# Patient Record
Sex: Male | Born: 1960 | Race: White | Hispanic: No | Marital: Married | Smoking: Former smoker
Health system: Southern US, Community
[De-identification: ages and names within clinical notes are randomized; demographics above are authoritative.]

---

## 2013-08-09 ENCOUNTER — Institutional Professional Consult (permissible substitution): Payer: Self-pay | Admitting: Critical Care Medicine

## 2013-09-06 ENCOUNTER — Institutional Professional Consult (permissible substitution): Payer: Self-pay | Admitting: Critical Care Medicine

## 2014-05-04 ENCOUNTER — Ambulatory Visit (INDEPENDENT_AMBULATORY_CARE_PROVIDER_SITE_OTHER)
Admission: RE | Admit: 2014-05-04 | Discharge: 2014-05-04 | Disposition: A | Discharge status: Home / Self Care | Payer: 59 | Source: Ambulatory Visit | Attending: Internal Medicine | Admitting: Internal Medicine

## 2014-05-04 ENCOUNTER — Ambulatory Visit (INDEPENDENT_AMBULATORY_CARE_PROVIDER_SITE_OTHER): Payer: 59 | Admitting: Internal Medicine

## 2014-05-04 ENCOUNTER — Encounter: Payer: Self-pay | Admitting: Internal Medicine

## 2014-05-04 ENCOUNTER — Other Ambulatory Visit (INDEPENDENT_AMBULATORY_CARE_PROVIDER_SITE_OTHER): Payer: 59

## 2014-05-04 VITALS — BP 126/84 | HR 60 | Ht 70.0 in | Wt 193.0 lb

## 2014-05-04 DIAGNOSIS — R05 Cough: Secondary | ICD-10-CM

## 2014-05-04 DIAGNOSIS — R058 Other specified cough: Secondary | ICD-10-CM

## 2014-05-04 LAB — CBC WITH DIFFERENTIAL/PLATELET
BASOS PCT: 0.7 % (ref 0.0–3.0)
Basophils Absolute: 0.1 10*3/uL (ref 0.0–0.1)
EOS ABS: 0.3 10*3/uL (ref 0.0–0.7)
EOS PCT: 3.9 % (ref 0.0–5.0)
HCT: 44.3 % (ref 39.0–52.0)
Hemoglobin: 14.9 g/dL (ref 13.0–17.0)
LYMPHS ABS: 2.1 10*3/uL (ref 0.7–4.0)
Lymphocytes Relative: 26.6 % (ref 12.0–46.0)
MCHC: 33.7 g/dL (ref 30.0–36.0)
MCV: 88.4 fl (ref 78.0–100.0)
MONO ABS: 0.5 10*3/uL (ref 0.1–1.0)
MONOS PCT: 6 % (ref 3.0–12.0)
NEUTROS ABS: 5 10*3/uL (ref 1.4–7.7)
Neutrophils Relative %: 62.8 % (ref 43.0–77.0)
Platelets: 450 10*3/uL — ABNORMAL HIGH (ref 150.0–400.0)
RBC: 5.01 Mil/uL (ref 4.22–5.81)
RDW: 13.8 % (ref 11.5–15.5)
WBC: 8 10*3/uL (ref 4.0–10.5)

## 2014-05-04 NOTE — Progress Notes (Signed)
Quick Note:  Spoke with pt and notified of results per Dr. Wert. Pt verbalized understanding and denied any questions.  ______ 

## 2014-05-04 NOTE — Patient Instructions (Signed)
prilosec 20 mg Take 30-60 min before first meal of the day and pepcid 20 mg at bedtime automatically   For drainage as needed take chlortrimeton (chlorpheniramine) 4 mg every 4 hours available over the counter (may cause drowsiness)   Stop allegra   GERD (REFLUX)  is an extremely common cause of respiratory symptoms just like yours , many times with no obvious heartburn at all.    It can be treated with medication, but also with lifestyle changes including avoidance of late meals, excessive alcohol, smoking cessation, and avoid fatty foods, chocolate, peppermint, colas, red wine, and acidic juices such as orange juice.  NO MINT OR MENTHOL PRODUCTS SO NO COUGH DROPS  USE SUGARLESS CANDY INSTEAD (Jolley ranchers or Stover's or Life Savers) or even ice chips will also do - the key is to swallow to prevent all throat clearing. NO OIL BASED VITAMINS - use powdered substitutes.  Please remember to go to the lab and x-ray department downstairs for your tests - we will call you with the results when they are available.  Please schedule a follow up office visit in 6 weeks, call sooner if needed with pfts

## 2014-05-04 NOTE — Progress Notes (Signed)
Subjective:    Patient ID: Justin Wright, male    DOB: 1960-03-15   MRN: [id number]  HPI   70 yowm quit smoking  2014 with cough dating back to around 2011 that worsened after quit and some better p started omeprazole /allegra but much worse with "colds"  3 x a year  eval by Dr Lysbeth Galas at the end of one of his " colds" and started on BREO and since has  developed a "different cough" so referred by Dr Lysbeth Galas to pulmonary clinic 05/04/2014    05/04/2014 1st [facility] office visit/ Justin Wright   Chief Complaint  Patient presents with  . Pulmonary Consult    Referred by Dr. Lysbeth Galas. Pt c/o cough on and off x 5 yrs "right now I am fine".  He states that when he has these "bouts of cough" he has alot of sputum productions and "feels like smothering at night".    typically no sob unless couging, bouts come on abruptly with lots of nasal and chest congestions but not really purulent sputum.  No obvious patterns in  day to day or daytime variabilty or assoc doe cp or chest tightness, subjective wheeze overt  hb symptoms. No unusual exp hx or h/o childhood pna/ asthma or knowledge of premature birth.  Sleeping ok without nocturnal  or early am exacerbation  of respiratory  c/o's or need for noct saba. Also denies any obvious fluctuation of symptoms with weather or environmental changes or other aggravating or alleviating factors except as outlined above   Current Medications, Allergies, Complete Past Medical History, Past Surgical History, Family History, and Social History were reviewed in [employer] record.      Review of Systems  Constitutional: Negative for fever, chills, activity change, appetite change and unexpected weight change.  HENT: Negative for congestion, dental problem, postnasal drip, rhinorrhea, sneezing, sore throat, trouble swallowing and voice change.   Eyes: Negative for visual disturbance.  Respiratory: Negative for cough, choking and shortness  of breath.   Cardiovascular: Negative for chest pain and leg swelling.  Gastrointestinal: Negative for nausea, vomiting and abdominal pain.  Genitourinary: Negative for difficulty urinating.  Musculoskeletal: Negative for arthralgias.  Skin: Negative for rash.  Psychiatric/Behavioral: Negative for behavioral problems and confusion.       Objective:   Physical Exam  amb wm nad   Wt Readings from Last 3 Encounters:  05/04/14 193 lb (87.544 kg)    Vital signs reviewed    HEENT: nl dentition, turbinates, and orophanx. Nl external ear canals without cough reflex   NECK :  without JVD/Nodes/TM/ nl carotid upstrokes bilaterally   LUNGS: no acc muscle use, clear to A and P bilaterally without cough on insp or exp maneuvers   CV:  RRR  no s3 or murmur or increase in P2, no edema   ABD:  soft and nontender with nl excursion in the supine position. No bruits or organomegaly, bowel sounds nl  MS:  warm without deformities, calf tenderness, cyanosis or clubbing  SKIN: warm and dry without lesions    NEURO:  alert, approp, no deficits     CXR PA and Lateral:   05/04/2014 :     I personally reviewed images and agree with radiology impression as follows:   The lungs are adequately inflated and clear. The heart and pulmonary vascularity are normal. The mediastinum is normal in width. There is no pleural effusion. The bony thorax is unremarkable.    Assessment &  Plan:

## 2014-05-04 NOTE — Assessment & Plan Note (Addendum)
The most common causes of chronic cough in immunocompetent adults include the following: upper airway cough syndrome (UACS), previously referred to as postnasal drip syndrome (PNDS), which is caused by variety of rhinosinus conditions; (2) asthma; (3) GERD; (4) chronic bronchitis from cigarette smoking or other inhaled environmental irritants; (5) nonasthmatic eosinophilic bronchitis; and (6) bronchiectasis.   These conditions, singly or in combination, have accounted for up to 94% of the causes of chronic cough in prospective studies.   Other conditions have constituted no >6% of the causes in prospective studies These have included bronchogenic carcinoma, chronic interstitial pneumonia, sarcoidosis, left ventricular failure, ACEI-induced cough, and aspiration from a condition associated with pharyngeal dysfunction.    Chronic cough is often simultaneously caused by more than one condition. A single cause has been found from 38 to 82% of the time, multiple causes from 18 to 62%. Multiply caused cough has been the result of three diseases up to 42% of the time.       Based on hx  (that his symptoms  worsened p he stopped smoking) and exam, this is most likely:  Classic Upper airway cough syndrome, so named because it's frequently impossible to sort out how much is  CR/sinusitis with freq throat clearing (which can be related to primary GERD)   vs  causing  secondary (" extra esophageal")  GERD from wide swings in gastric pressure that occur with throat clearing, often  promoting self use of mint and menthol lozenges that reduce the lower esophageal sphincter tone and exacerbate the problem further in a cyclical fashion.   These are the same pts (now being labeled as having "irritable larynx syndrome" by some cough centers) who not infrequently have a history of having failed to tolerate ace inhibitors,  dry powder inhalers(like Breo, which he thinks changed the cough but didn't eliminate it)  or  biphosphonates or report having atypical reflux symptoms that don't respond to standard doses of PPI , and are easily confused as having aecopd or asthma flares by even experienced allergists/ pulmonologists.   The first step is to maximize acid suppression chronically and see if cough flares as bad next time off all maint inhalers and consider rx for cough variant asthma if this pattern persists on max gerd rx  In addition,  Since he was a smoker, needs baseline pfts on return   See instructions for specific recommendations which were reviewed directly with the patient who was given a copy with highlighter outlining the key components.

## 2014-05-05 LAB — ALLERGY FULL PROFILE
Allergen, D pternoyssinus,d7: <0.1 kU/L
Alternaria Alternata: <0.1 kU/L
Aspergillus fumigatus, m3: <0.1 kU/L
Bahia Grass: <0.1 kU/L
Bermuda Grass: <0.1 kU/L
Cat Dander: <0.1 kU/L
Common Ragweed: 0.41 kU/L — ABNORMAL HIGH
D. farinae: <0.1 kU/L
Dog Dander: <0.1 kU/L
Fescue: 0.58 kU/L — ABNORMAL HIGH
G005 RYE, PERENNIAL: 0.61 kU/L — AB
G009 Red Top: 0.52 kU/L — ABNORMAL HIGH
Goldenrod: <0.1 kU/L
Helminthosporium halodes: <0.1 kU/L
House Dust Hollister: <0.1 kU/L
IgE (Immunoglobulin E), Serum: 38 kU/L (ref <115)
Lamb's Quarters: <0.1 kU/L
Plantain: <0.1 kU/L
Stemphylium Botryosum: <0.1 kU/L
Sycamore Tree: 0.11 kU/L — ABNORMAL HIGH
Timothy Grass: 0.61 kU/L — ABNORMAL HIGH

## 2014-06-15 ENCOUNTER — Ambulatory Visit (INDEPENDENT_AMBULATORY_CARE_PROVIDER_SITE_OTHER): Payer: 59 | Admitting: Internal Medicine

## 2014-06-15 ENCOUNTER — Encounter: Payer: Self-pay | Admitting: Internal Medicine

## 2014-06-15 VITALS — BP 148/88 | HR 50 | Ht 69.0 in | Wt 193.0 lb

## 2014-06-15 DIAGNOSIS — R05 Cough: Secondary | ICD-10-CM

## 2014-06-15 DIAGNOSIS — R058 Other specified cough: Secondary | ICD-10-CM

## 2014-06-15 LAB — PULMONARY FUNCTION TEST
DL/VA % PRED: 93 %
DL/VA: 4.25 ml/min/mmHg/L
DLCO unc % pred: 92 %
DLCO unc: 28.68 ml/min/mmHg
FEF 25-75 Post: 2.88 L/sec
FEF 25-75 Pre: 2.76 L/sec
FEF2575-%CHANGE-POST: 4 %
FEF2575-%PRED-PRE: 87 %
FEF2575-%Pred-Post: 91 %
FEV1-%Change-Post: 1 %
FEV1-%PRED-POST: 98 %
FEV1-%PRED-PRE: 97 %
FEV1-Post: 3.6 L
FEV1-Pre: 3.56 L
FEV1FVC-%Change-Post: 0 %
FEV1FVC-%PRED-PRE: 97 %
FEV6-%CHANGE-POST: 1 %
FEV6-%PRED-PRE: 102 %
FEV6-%Pred-Post: 104 %
FEV6-Post: 4.77 L
FEV6-Pre: 4.69 L
FEV6FVC-%CHANGE-POST: 0 %
FEV6FVC-%Pred-Post: 104 %
FEV6FVC-%Pred-Pre: 103 %
FVC-%Change-Post: 1 %
FVC-%Pred-Post: 100 %
FVC-%Pred-Pre: 99 %
FVC-POST: 4.78 L
FVC-Pre: 4.73 L
POST FEV1/FVC RATIO: 75 %
POST FEV6/FVC RATIO: 100 %
PRE FEV1/FVC RATIO: 75 %
PRE FEV6/FVC RATIO: 100 %
RV % pred: 98 %
RV: 2.05 L
TLC % pred: 96 %
TLC: 6.58 L

## 2014-06-15 NOTE — Assessment & Plan Note (Signed)
-   allergy profile 05/04/2014 >  IgE 38 Pos RAST for  grass/ trees  - PFT's wnl 06/15/2014   I had an extended final summary discussion with the patient reviewing all relevant studies completed to date and  lasting 15 to 20 minutes of a 25 minute visit on the following issues:    1) No evidence at all for copd/ can't exclude asthma but doubt it  2) mutiple possibilities for what "sparks" the fire (viruses in winter, allergies in spring for example) but what likely drives his symptoms is cyclical coughing and gerd.  Explained the natural history of uri and why it's necessary in patients at risk to treat GERD aggressively - at least  short term -   to reduce risk of evolving cyclical cough initially  triggered by epithelial injury and a heightened sensitivty to the effects of any upper airway irritants,  most importantly acid - related - then perpetuated by epithelial injury related to the cough itself as the upper airway collapses on itself.  That is, the more sensitive the epithelium becomes once it is damaged by the virus, the more the ensuing irritability> the more the cough, the more the secondary reflux (especially in those prone to reflux) the more the irritation of the sensitive mucosa and so on in a  Classic cyclical pattern.     3) He might need to be considered for NF at some point but for now just rec he continue gerd rx x 3 m then taper and gi f/u prn  4) pulmonary f/u also prn

## 2014-06-15 NOTE — Progress Notes (Signed)
PFT done today. 

## 2014-06-15 NOTE — Progress Notes (Signed)
   Subjective:    Patient ID: Justin Wright, male    DOB: Oct 05, 1960   MRN: [id number]     Brief patient profile:  53 yowm quit smoking  2014 with cough dating back to around 2011 that worsened after quit and some better p started omeprazole /allegra but much worse with "colds"  3 x a year  eval by Dr Lysbeth Galas at the end of one of his " colds" and started on BREO and since has  developed a "different cough" so referred by Dr Lysbeth Galas to pulmonary clinic 05/04/2014     History of Present Illness  05/04/2014 1st [facility] office visit/ Justin Wright   Chief Complaint  Patient presents with  . Pulmonary Consult    Referred by Dr. Lysbeth Galas. Pt c/o cough on and off x 5 yrs "right now I am fine".  He states that when he has these "bouts of cough" he has alot of sputum productions and "feels like smothering at night".    typically no sob unless couging, bouts come on abruptly with lots of nasal and chest congestions but not really purulent sputum. rec prilosec 20 mg Take 30-60 min before first meal of the day and pepcid 20 mg at bedtime automatically  For drainage as needed take chlortrimeton (chlorpheniramine) 4 mg every 4 hours available over the counter (may cause drowsiness)  Stop allegra  GERD diet    06/15/2014 f/u ov/Justin Wright re: chronic cough x 5 y better on gerd rx  Chief Complaint  Patient presents with  . Follow-up    Discuss PFT results. Pt states breathing has improved.   in retrospect rx gerd has improved coughing  But doesn't stick with it            Objective:   Physical Exam  amb wm nad  Wt Readings from Last 3 Encounters:  06/15/14 193 lb (87.544 kg)  05/04/14 193 lb (87.544 kg)    Vital signs reviewed   HEENT: nl dentition, turbinates, and orophanx. Nl external ear canals without cough reflex   NECK :  without JVD/Nodes/TM/ nl carotid upstrokes bilaterally   LUNGS: no acc muscle use, clear to A and P bilaterally without cough on insp or exp maneuvers   CV:   RRR  no s3 or murmur or increase in P2, no edema   ABD:  soft and nontender with nl excursion in the supine position. No bruits or organomegaly, bowel sounds nl  MS:  warm without deformities, calf tenderness, cyanosis or clubbing  SKIN: warm and dry without lesions    NEURO:  alert, approp, no deficits     CXR PA and Lateral:   05/04/2014 :     I personally reviewed images and agree with radiology impression as follows:   The lungs are adequately inflated and clear. The heart and pulmonary vascularity are normal. The mediastinum is normal in width. There is no pleural effusion. The bony thorax is unremarkable.    Assessment & Plan:   Outpatient Encounter Prescriptions as of 06/15/2014  Medication Sig  . chlorpheniramine (CHLOR-TRIMETON) 4 MG tablet Take 4 mg by mouth as needed for allergies.  . famotidine (PEPCID) 20 MG tablet Take 20 mg by mouth 2 (two) times daily.  Marland Kitchen omeprazole (PRILOSEC) 20 MG capsule Take 20 mg by mouth daily.  . [DISCONTINUED] fexofenadine (ALLEGRA) 180 MG tablet Take 180 mg by mouth daily.

## 2014-06-15 NOTE — Patient Instructions (Signed)
Don't change any thing for 3 months then ok to taper it off by stopping omeprazole first then pepcid and restart immediately for any resp symptoms   If cough flares > delsym and see me if lasts more than 2weeks  Nissan procedure may be an option in future should you find you can't stop your acid suppression    If you are satisfied with your treatment plan,  let your doctor know and he/she can either refill your medications or you can return here when your prescription runs out.     If in any way you are not 100% satisfied,  please tell us.  If 100% better, tell your friends!  Pulmonary follow up is as needed

## 2016-03-05 IMAGING — CR DG CHEST 2V
2 series · 2 of 2 positions shown · non-contrast
Comparison: None.

CLINICAL DATA: Five years of chronic cough with intermittent
shortness of breath; previous history of tobacco use

EXAM:
CHEST  2 VIEW

[view not recorded (1 of 2)]
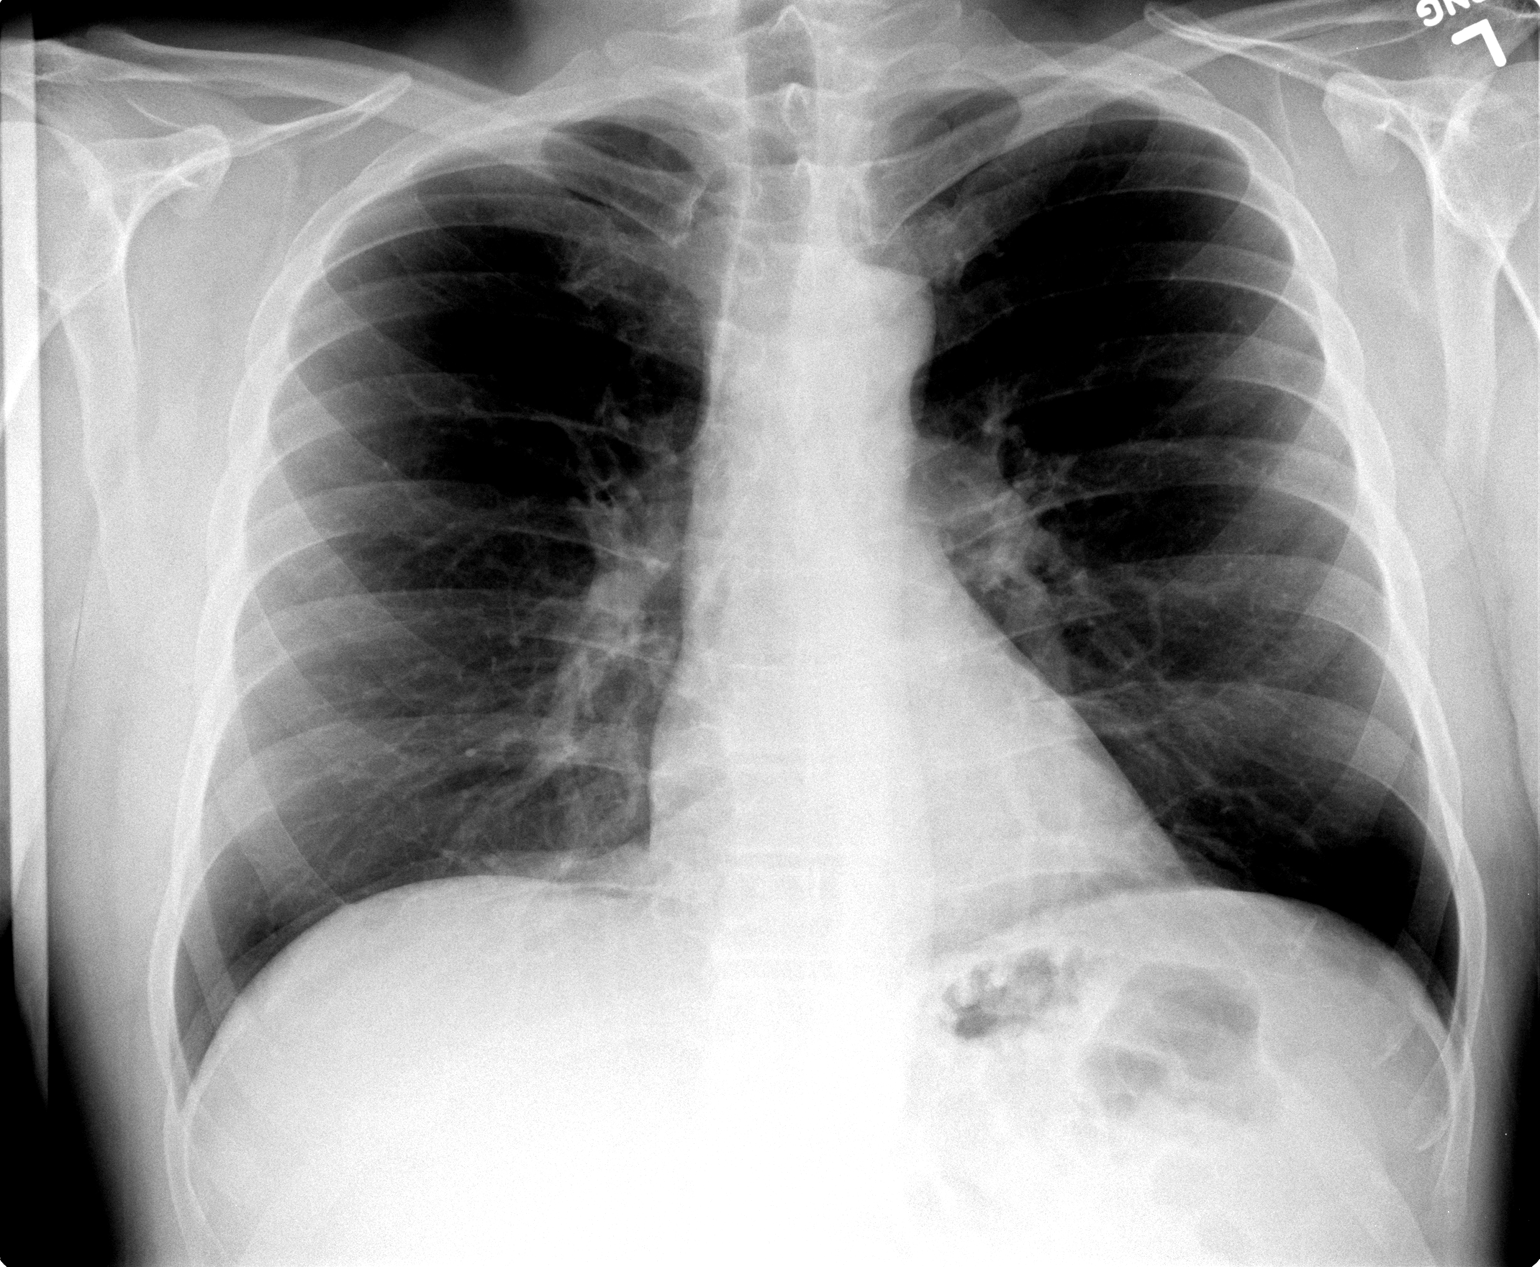

[view not recorded (2 of 2)]
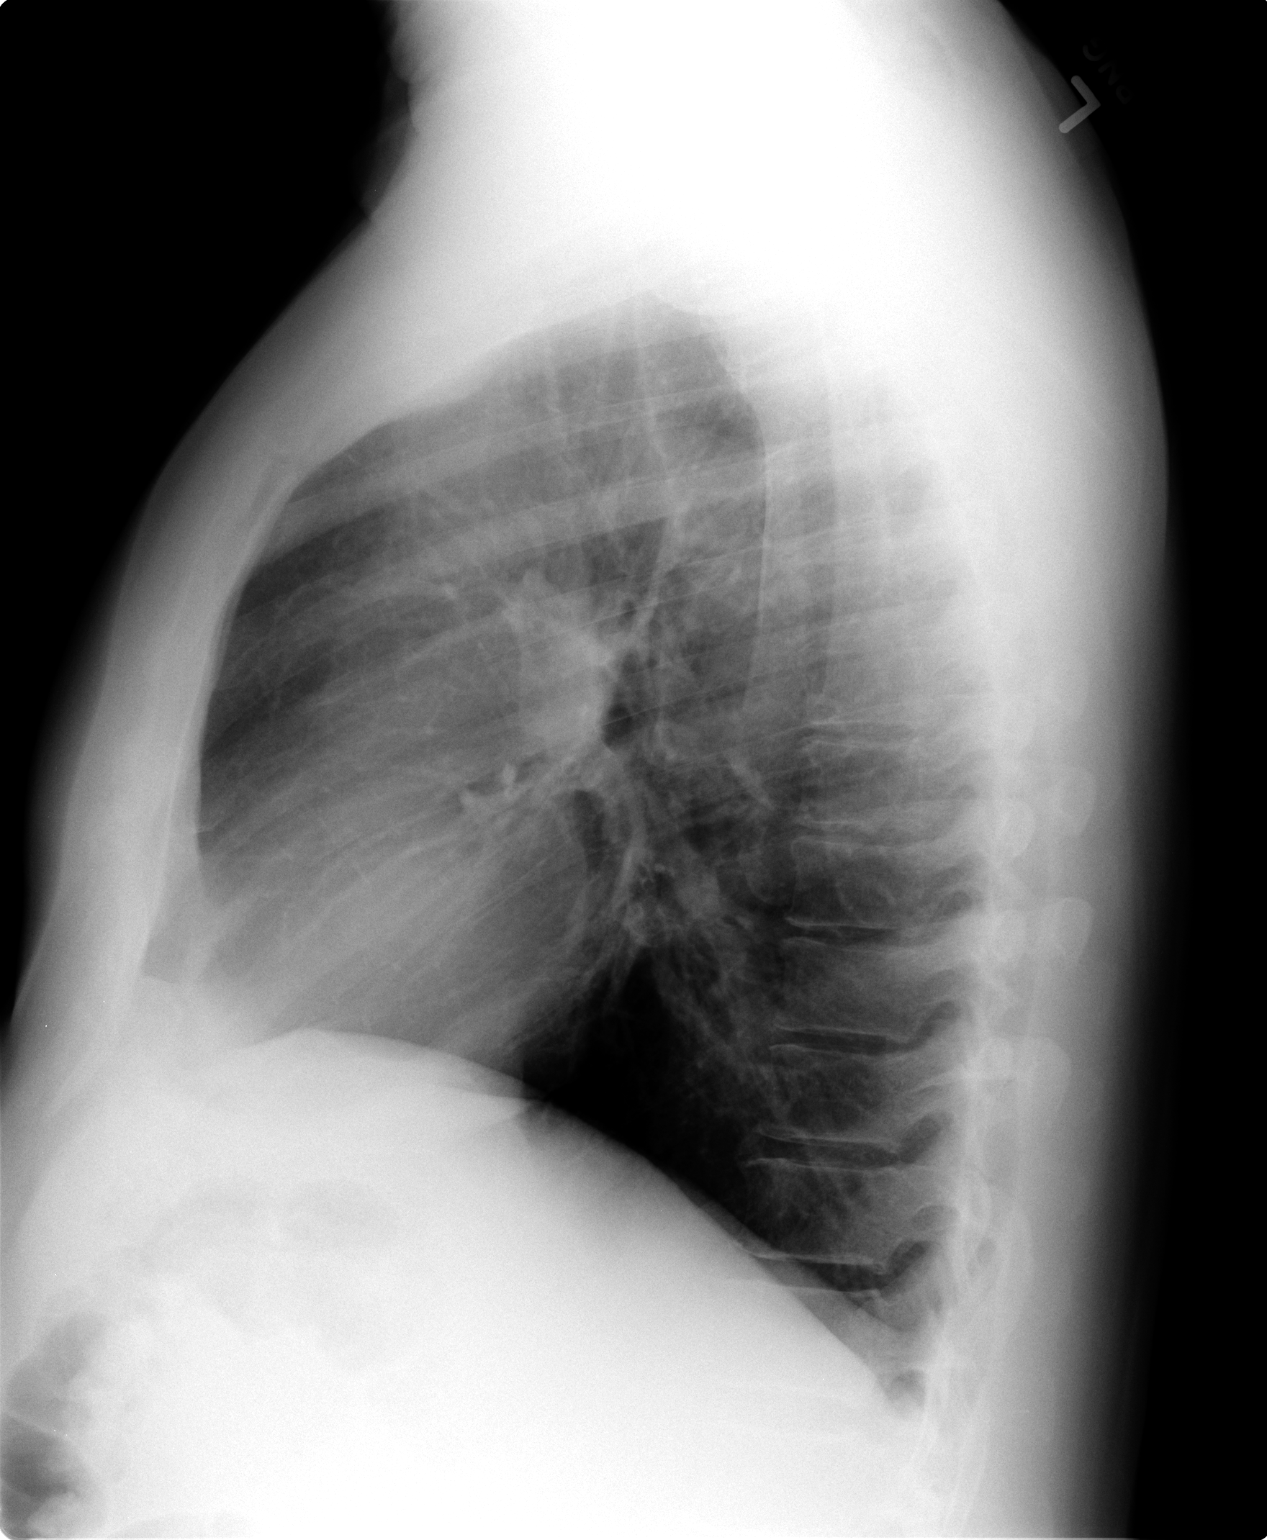

[2 of 2 positions shown; findings below may reference images not displayed]

FINDINGS: The lungs are adequately inflated and clear. The heart and pulmonary
vascularity are normal. The mediastinum is normal in width. There is
no pleural effusion. The bony thorax is unremarkable.
IMPRESSION: There is no active cardiopulmonary disease.

## 2016-11-07 DIAGNOSIS — Z125 Encounter for screening for malignant neoplasm of prostate: Secondary | ICD-10-CM | POA: Diagnosis not present

## 2016-11-07 DIAGNOSIS — Z0000 Encounter for general adult medical examination without abnormal findings: Secondary | ICD-10-CM | POA: Diagnosis not present

## 2017-09-14 DIAGNOSIS — D1801 Hemangioma of skin and subcutaneous tissue: Secondary | ICD-10-CM | POA: Diagnosis not present

## 2017-09-14 DIAGNOSIS — D485 Neoplasm of uncertain behavior of skin: Secondary | ICD-10-CM | POA: Diagnosis not present

## 2017-09-14 DIAGNOSIS — L578 Other skin changes due to chronic exposure to nonionizing radiation: Secondary | ICD-10-CM | POA: Diagnosis not present

## 2021-09-30 ENCOUNTER — Ambulatory Visit: Payer: 59 | Admitting: Allergy and Immunology

## 2023-10-16 ENCOUNTER — Emergency Department (HOSPITAL_COMMUNITY): Admitting: Certified Registered Nurse Anesthetist

## 2023-10-16 ENCOUNTER — Encounter (HOSPITAL_COMMUNITY): Payer: Self-pay | Admitting: Anesthesiology

## 2023-10-16 ENCOUNTER — Emergency Department (HOSPITAL_BASED_OUTPATIENT_CLINIC_OR_DEPARTMENT_OTHER): Admitting: Certified Registered Nurse Anesthetist

## 2023-10-16 ENCOUNTER — Ambulatory Visit (HOSPITAL_COMMUNITY)
Admission: EM | Admit: 2023-10-16 | Discharge: 2023-10-16 | Disposition: A | Discharge status: Home / Self Care | Attending: Emergency Medicine | Admitting: Emergency Medicine

## 2023-10-16 ENCOUNTER — Encounter (HOSPITAL_COMMUNITY)
Admission: EM | Disposition: A | Discharge status: Home / Self Care | Payer: Self-pay | Source: Home / Self Care | Attending: Emergency Medicine

## 2023-10-16 ENCOUNTER — Emergency Department (HOSPITAL_COMMUNITY)

## 2023-10-16 ENCOUNTER — Encounter (HOSPITAL_COMMUNITY): Payer: Self-pay

## 2023-10-16 ENCOUNTER — Other Ambulatory Visit: Payer: Self-pay

## 2023-10-16 DIAGNOSIS — Z87891 Personal history of nicotine dependence: Secondary | ICD-10-CM | POA: Insufficient documentation

## 2023-10-16 DIAGNOSIS — W458XXA Other foreign body or object entering through skin, initial encounter: Secondary | ICD-10-CM | POA: Diagnosis not present

## 2023-10-16 DIAGNOSIS — Z79899 Other long term (current) drug therapy: Secondary | ICD-10-CM | POA: Diagnosis not present

## 2023-10-16 DIAGNOSIS — K219 Gastro-esophageal reflux disease without esophagitis: Secondary | ICD-10-CM | POA: Diagnosis not present

## 2023-10-16 DIAGNOSIS — S68625A Partial traumatic transphalangeal amputation of left ring finger, initial encounter: Secondary | ICD-10-CM | POA: Insufficient documentation

## 2023-10-16 DIAGNOSIS — W3189XA Contact with other specified machinery, initial encounter: Secondary | ICD-10-CM | POA: Diagnosis not present

## 2023-10-16 DIAGNOSIS — S68115A Complete traumatic metacarpophalangeal amputation of left ring finger, initial encounter: Secondary | ICD-10-CM | POA: Diagnosis not present

## 2023-10-16 DIAGNOSIS — S68119A Complete traumatic metacarpophalangeal amputation of unspecified finger, initial encounter: Secondary | ICD-10-CM

## 2023-10-16 HISTORY — PX: AMPUTATION FINGER: SHX6594

## 2023-10-16 LAB — BASIC METABOLIC PANEL WITH GFR
Anion gap: 13 (ref 5–15)
BUN: 15 mg/dL (ref 8–23)
CO2: 24 mmol/L (ref 22–32)
Calcium: 9.7 mg/dL (ref 8.9–10.3)
Chloride: 106 mmol/L (ref 98–111)
Creatinine, Ser: 0.96 mg/dL (ref 0.61–1.24)
GFR, Estimated: >60 mL/min (ref >60)
Glucose, Bld: 84 mg/dL (ref 70–99)
Potassium: 4.1 mmol/L (ref 3.5–5.1)
Sodium: 144 mmol/L (ref 135–145)

## 2023-10-16 LAB — CBC WITH DIFFERENTIAL/PLATELET
Abs Immature Granulocytes: 0.02 K/uL (ref 0.00–0.07)
Basophils Absolute: 0.1 K/uL (ref 0.0–0.1)
Basophils Relative: 1 %
Eosinophils Absolute: 0.3 K/uL (ref 0.0–0.5)
Eosinophils Relative: 4 %
HCT: 42.7 % (ref 39.0–52.0)
Hemoglobin: 13.5 g/dL (ref 13.0–17.0)
Immature Granulocytes: 0 %
Lymphocytes Relative: 20 %
Lymphs Abs: 1.5 K/uL (ref 0.7–4.0)
MCH: 29.7 pg (ref 26.0–34.0)
MCHC: 31.6 g/dL (ref 30.0–36.0)
MCV: 94.1 fL (ref 80.0–100.0)
Monocytes Absolute: 0.5 K/uL (ref 0.1–1.0)
Monocytes Relative: 7 %
Neutro Abs: 5.1 K/uL (ref 1.7–7.7)
Neutrophils Relative %: 68 %
Platelets: 281 K/uL (ref 150–400)
RBC: 4.54 MIL/uL (ref 4.22–5.81)
RDW: 13.2 % (ref 11.5–15.5)
WBC: 7.5 K/uL (ref 4.0–10.5)
nRBC: 0 % (ref 0.0–0.2)

## 2023-10-16 SURGERY — AMPUTATION, FINGER
Anesthesia: Choice | Laterality: Left

## 2023-10-16 SURGERY — AMPUTATION, FINGER
Anesthesia: Monitor Anesthesia Care | Laterality: Left

## 2023-10-16 MED ORDER — OXYCODONE HCL 5 MG/5ML PO SOLN: 5.0000 mg | Freq: Once | ORAL | Status: DC | PRN

## 2023-10-16 MED ORDER — BACITRACIN ZINC 500 UNIT/GM EX OINT
TOPICAL_OINTMENT | CUTANEOUS | Status: AC
  Filled 2023-10-16: qty 28.35

## 2023-10-16 MED ORDER — ONDANSETRON HCL 4 MG/2ML IJ SOLN
INTRAMUSCULAR | Status: DC | PRN
  Administered 2023-10-16: 4 mg via INTRAVENOUS

## 2023-10-16 MED ORDER — FENTANYL CITRATE (PF) 100 MCG/2ML IJ SOLN
INTRAMUSCULAR | Status: AC
Start: 2023-10-16 — End: 2023-10-16
  Filled 2023-10-16: qty 2

## 2023-10-16 MED ORDER — FENTANYL CITRATE (PF) 250 MCG/5ML IJ SOLN
INTRAMUSCULAR | Status: DC | PRN
  Administered 2023-10-16: 50 ug via INTRAVENOUS

## 2023-10-16 MED ORDER — MIDAZOLAM HCL 2 MG/2ML IJ SOLN
INTRAMUSCULAR | Status: AC
  Filled 2023-10-16: qty 2

## 2023-10-16 MED ORDER — CEFAZOLIN SODIUM-DEXTROSE 2-4 GM/100ML-% IV SOLN
2.0000 g | Freq: Once | INTRAVENOUS | Status: AC
  Administered 2023-10-16: 2 g via INTRAVENOUS
  Filled 2023-10-16: qty 100

## 2023-10-16 MED ORDER — 0.9 % SODIUM CHLORIDE (POUR BTL) OPTIME
TOPICAL | Status: DC | PRN
  Administered 2023-10-16: 1000 mL

## 2023-10-16 MED ORDER — DROPERIDOL 2.5 MG/ML IJ SOLN: 0.6250 mg | Freq: Once | INTRAMUSCULAR | Status: DC | PRN

## 2023-10-16 MED ORDER — HYDROMORPHONE HCL 1 MG/ML IJ SOLN
1.0000 mg | Freq: Once | INTRAMUSCULAR | Status: AC
  Administered 2023-10-16: 1 mg via INTRAVENOUS
  Filled 2023-10-16: qty 1

## 2023-10-16 MED ORDER — PROPOFOL 10 MG/ML IV BOLUS
INTRAVENOUS | Status: DC | PRN
  Administered 2023-10-16: 20 mg via INTRAVENOUS

## 2023-10-16 MED ORDER — LACTATED RINGERS IV SOLN: INTRAVENOUS | Status: DC | PRN

## 2023-10-16 MED ORDER — FENTANYL CITRATE PF 50 MCG/ML IJ SOSY: 25.0000 ug | PREFILLED_SYRINGE | INTRAMUSCULAR | Status: DC | PRN

## 2023-10-16 MED ORDER — BUPIVACAINE HCL (PF) 0.25 % IJ SOLN
INTRAMUSCULAR | Status: AC
  Filled 2023-10-16: qty 30

## 2023-10-16 MED ORDER — BACITRACIN ZINC 500 UNIT/GM EX OINT
TOPICAL_OINTMENT | CUTANEOUS | Status: DC | PRN
  Administered 2023-10-16: 1 via TOPICAL

## 2023-10-16 MED ORDER — PROPOFOL 500 MG/50ML IV EMUL
INTRAVENOUS | Status: DC | PRN
  Administered 2023-10-16: 150 ug/kg/min via INTRAVENOUS

## 2023-10-16 MED ORDER — LIDOCAINE 2% (20 MG/ML) 5 ML SYRINGE
INTRAMUSCULAR | Status: DC | PRN
  Administered 2023-10-16: 40 mg via INTRAVENOUS

## 2023-10-16 MED ORDER — BUPIVACAINE HCL (PF) 0.25 % IJ SOLN
INTRAMUSCULAR | Status: DC | PRN
  Administered 2023-10-16: 10 mL

## 2023-10-16 MED ORDER — TETANUS-DIPHTH-ACELL PERTUSSIS 5-2.5-18.5 LF-MCG/0.5 IM SUSY
0.5000 mL | PREFILLED_SYRINGE | Freq: Once | INTRAMUSCULAR | Status: AC
  Administered 2023-10-16: 0.5 mL via INTRAMUSCULAR
  Filled 2023-10-16: qty 0.5

## 2023-10-16 MED ORDER — CEFAZOLIN SODIUM-DEXTROSE 2-4 GM/100ML-% IV SOLN: 2.0000 g | INTRAVENOUS | Status: DC

## 2023-10-16 MED ORDER — OXYCODONE HCL 5 MG PO TABS: 5.0000 mg | ORAL_TABLET | Freq: Four times a day (QID) | ORAL | 0 refills | Status: AC | PRN

## 2023-10-16 MED ORDER — MORPHINE SULFATE (PF) 4 MG/ML IV SOLN
4.0000 mg | Freq: Once | INTRAVENOUS | Status: AC
  Administered 2023-10-16: 4 mg via INTRAVENOUS
  Filled 2023-10-16: qty 1

## 2023-10-16 MED ORDER — OXYCODONE HCL 5 MG PO TABS: 5.0000 mg | ORAL_TABLET | Freq: Once | ORAL | Status: DC | PRN

## 2023-10-16 MED ORDER — CEFAZOLIN SODIUM-DEXTROSE 1-4 GM/50ML-% IV SOLN: 1.0000 g | Freq: Once | INTRAVENOUS | Status: DC

## 2023-10-16 MED ORDER — MIDAZOLAM HCL 2 MG/2ML IJ SOLN
INTRAMUSCULAR | Status: DC | PRN
  Administered 2023-10-16: 2 mg via INTRAVENOUS

## 2023-10-16 MED ORDER — CEPHALEXIN 500 MG PO CAPS: 500.0000 mg | ORAL_CAPSULE | Freq: Four times a day (QID) | ORAL | 0 refills | Status: AC

## 2023-10-16 SURGICAL SUPPLY — 60 items
BAG COUNTER SPONGE SURGICOUNT (BAG) ×2 IMPLANT
BLADE SURG 15 STRL LF DISP TIS (BLADE) ×4 IMPLANT
BNDG COHESIVE 1X5 TAN STRL LF (GAUZE/BANDAGES/DRESSINGS) IMPLANT
BNDG ELASTIC 2INX 5YD STR LF (GAUZE/BANDAGES/DRESSINGS) ×2 IMPLANT
BNDG ELASTIC 3INX 5YD STR LF (GAUZE/BANDAGES/DRESSINGS) ×2 IMPLANT
BNDG ELASTIC 4INX 5YD STR LF (GAUZE/BANDAGES/DRESSINGS) ×2 IMPLANT
BNDG ESMARK 4X9 LF (GAUZE/BANDAGES/DRESSINGS) ×2 IMPLANT
BNDG GAUZE DERMACEA FLUFF 4 (GAUZE/BANDAGES/DRESSINGS) ×6 IMPLANT
BNDG PLASTER X FAST 3X3 WHT LF (CAST SUPPLIES) ×2 IMPLANT
CATH ROBINSON RED A/P 10FR (CATHETERS) IMPLANT
CHLORAPREP W/TINT 26 (MISCELLANEOUS) ×2 IMPLANT
CORD BIPOLAR FORCEPS 12FT (ELECTRODE) ×2 IMPLANT
COVER BACK TABLE 60X90IN (DRAPES) ×2 IMPLANT
COVER MAYO STAND STRL (DRAPES) ×2 IMPLANT
COVER SURGICAL LIGHT HANDLE (MISCELLANEOUS) ×2 IMPLANT
CUFF TOURN SGL QUICK 18X4 (TOURNIQUET CUFF) ×2 IMPLANT
CUFF TRNQT CYL 24X4X16.5-23 (TOURNIQUET CUFF) IMPLANT
DRAIN PENROSE 0.25X18 (DRAIN) ×2 IMPLANT
DRAPE EXTREMITY T 121X128X90 (DISPOSABLE) ×2 IMPLANT
DRAPE OEC MINIVIEW 54X84 (DRAPES) IMPLANT
DRAPE SHEET LG 3/4 BI-LAMINATE (DRAPES) ×2 IMPLANT
DRAPE SURG 17X23 STRL (DRAPES) ×2 IMPLANT
DRSG ADAPTIC 3X8 NADH LF (GAUZE/BANDAGES/DRESSINGS) ×2 IMPLANT
DRSG XEROFORM 1X8 (GAUZE/BANDAGES/DRESSINGS) IMPLANT
GAUZE PAD ABD 8X10 STRL (GAUZE/BANDAGES/DRESSINGS) ×2 IMPLANT
GAUZE SPONGE 4X4 12PLY STRL (GAUZE/BANDAGES/DRESSINGS) ×2 IMPLANT
GAUZE STRETCH 2X75IN STRL (MISCELLANEOUS) IMPLANT
GAUZE XEROFORM 1X8 LF (GAUZE/BANDAGES/DRESSINGS) ×2 IMPLANT
GLOVE BIOGEL M 7.0 STRL (GLOVE) ×2 IMPLANT
GLOVE BIOGEL PI IND STRL 7.0 (GLOVE) ×2 IMPLANT
GLOVE SS BIOGEL STRL SZ 8 (GLOVE) ×2 IMPLANT
GLOVE SURG LX STRL 8.0 MICRO (GLOVE) ×2 IMPLANT
GOWN STRL REUS W/ TWL LRG LVL3 (GOWN DISPOSABLE) ×4 IMPLANT
GOWN STRL REUS W/ TWL XL LVL3 (GOWN DISPOSABLE) ×4 IMPLANT
KIT BASIN OR (CUSTOM PROCEDURE TRAY) ×2 IMPLANT
KIT TURNOVER KIT A (KITS) ×2 IMPLANT
LOOP VESSEL MAXI BLUE (MISCELLANEOUS) ×2 IMPLANT
MANIFOLD NEPTUNE II (INSTRUMENTS) ×2 IMPLANT
NDL HYPO 25X1 1.5 SAFETY (NEEDLE) ×2 IMPLANT
NEEDLE ANCHOR KEITH 2 7/8 STR (NEEDLE) IMPLANT
NEEDLE HYPO 25X1 1.5 SAFETY (NEEDLE) ×1 IMPLANT
NS IRRIG 1000ML POUR BTL (IV SOLUTION) ×2 IMPLANT
PACK ORTHO EXTREMITY (CUSTOM PROCEDURE TRAY) ×2 IMPLANT
PAD ARMBOARD POSITIONER FOAM (MISCELLANEOUS) ×2 IMPLANT
PAD CAST 4YDX4 CTTN HI CHSV (CAST SUPPLIES) ×4 IMPLANT
PADDING CAST ABS COTTON 3X4 (CAST SUPPLIES) ×2 IMPLANT
SET IRRIG Y TYPE TUR BLADDER L (SET/KITS/TRAYS/PACK) ×2 IMPLANT
SOL PREP POV-IOD 4OZ 10% (MISCELLANEOUS) ×4 IMPLANT
SPIKE FLUID TRANSFER (MISCELLANEOUS) ×2 IMPLANT
SPONGE T-LAP 4X18 ~~LOC~~+RFID (SPONGE) ×2 IMPLANT
SUT ETHILON 4 0 PS 2 18 (SUTURE) ×6 IMPLANT
SUT VICRYL RAPIDE 4/0 PS 2 (SUTURE) IMPLANT
SUTURE FIBERWR 3-0 18 TAPR NDL (SUTURE) ×2 IMPLANT
SWAB CULTURE ESWAB REG 1ML (MISCELLANEOUS) IMPLANT
SYR BULB EAR ULCER 3OZ GRN STR (SYRINGE) ×2 IMPLANT
SYR CONTROL 10ML LL (SYRINGE) IMPLANT
TUBING CONNECTING 10 (TUBING) ×2 IMPLANT
UNDERPAD 30X36 HEAVY ABSORB (UNDERPADS AND DIAPERS) ×2 IMPLANT
WATER STERILE IRR 1000ML POUR (IV SOLUTION) ×2 IMPLANT
YANKAUER SUCT BULB TIP NO VENT (SUCTIONS) ×2 IMPLANT

## 2023-10-16 NOTE — ED Provider Notes (Signed)
 [place] EMERGENCY DEPARTMENT AT [facility] Provider Note   CSN: [id number] Arrival date & time: 10/16/23  1335     Patient presents with: Hand Injury   KARAM DUNSON is a 63 y.o. male.   HPI   63 year old male presents emergency department with left ring finger injury. Sustained a distal amputation after using a log splitter. Does not have the finger tip with him. Is not on [facility], last Tdap around 10 years ago. No other injury sustained.  Prior to Admission medications   Medication Sig Start Date End Date Taking? Authorizing Provider  chlorpheniramine (CHLOR-TRIMETON) 4 MG tablet Take 4 mg by mouth as needed for allergies.    [provider]  famotidine (PEPCID) 20 MG tablet Take 20 mg by mouth 2 (two) times daily.    [provider]  omeprazole (PRILOSEC) 20 MG capsule Take 20 mg by mouth daily.    [provider]    Allergies: Patient has no known allergies.    Review of Systems  Constitutional:  Negative for fever.  Respiratory:  Negative for shortness of breath.   Cardiovascular:  Negative for chest pain.  Musculoskeletal:        Left ring finger injury  Neurological:  Negative for numbness.    Updated Vital Signs BP (!) 165/98 (BP Location: Right Arm)   Pulse 71   Temp 98 F (36.7 C) (Oral)   Resp 20   SpO2 97%   Physical Exam Vitals and nursing note reviewed.  Constitutional:      General: He is not in acute distress.    Appearance: Normal appearance.  HENT:     Head: Normocephalic.     Mouth/Throat:     Mouth: Mucous membranes are moist.  Cardiovascular:     Rate and Rhythm: Normal rate.  Pulmonary:     Effort: Pulmonary effort is normal. No respiratory distress.  Musculoskeletal:     Comments: Traumatic amputation of the left ring finger with exposed bone and tendon  Skin:    General: Skin is warm.  Neurological:     Mental Status: He is alert. Mental status is at baseline.  Psychiatric:        Mood and  Affect: Mood normal.          (all labs ordered are listed, but only abnormal results are displayed) Labs Reviewed  CBC WITH DIFFERENTIAL/PLATELET  BASIC METABOLIC PANEL WITH GFR    EKG: None  Radiology: DG Finger Ring Left Result Date: 10/16/2023 CLINICAL DATA:  Traumatic amputation. EXAM: LEFT RING FINGER 2+V COMPARISON:  None Available. FINDINGS: Status post traumatic amputation of the distal portion of the fourth distal phalanx with surrounding soft tissues. Moderately displaced fracture of proximal portion of fourth distal phalanx is noted. IMPRESSION: Traumatic amputation of distal portion of fourth distal phalanx with surrounding soft tissues. Moderately displaced fracture of proximal portion of fourth distal phalanx is noted. Electronically Signed   By: Lynwood Landy Raddle M.D.   On: 10/16/2023 16:41     Procedures   Medications Ordered in the ED  Tdap (BOOSTRIX) injection 0.5 mL (0.5 mLs Intramuscular Given 10/16/23 1607)  morphine  (PF) 4 MG/ML injection 4 mg (4 mg Intravenous Given 10/16/23 1605)  ceFAZolin  (ANCEF ) IVPB 2g/100 mL premix (2 g Intravenous New Bag/Given 10/16/23 1626)  Medical Decision Making Amount and/or Complexity of Data Reviewed Labs: ordered. Radiology: ordered.  Risk Prescription drug management.   63 year old male presents emergency department with left ring finger amputation using a wood splitting machine.  Currently does not have the tip of the finger with him.  This happened about 3 hours prior to arrival.  Not on anticoagulation, bleeding controlled, no other injury noted.  Vitals normal and stable.  He has an amputation of the fourth distal phalanx of the left hand.  Bleeding is controlled, there is exposed bone.  Blood work is normal.  X-ray confirms fourth distal phalanx amputation with displaced fracture.  Tetanus updated, Ancef  given.  Contacted hand surgeon Dr. Romona.  He has reviewed the chart and  recommends surgical repair tonight.  Patient will be taken to the OR with Dr. Romona.  On reevaluation patient's pain is currently controlled, vitals are normal.  Patient updated about surgical plan.  Patient taken to the OR with surgical staff.  Stable at time of transfer.     Final diagnoses:  None    ED Discharge Orders     None          Bari Roxie HERO, DO 10/16/23 1859

## 2023-10-16 NOTE — Interval H&P Note (Signed)
 History and Physical Interval Note:  10/16/2023 7:18 PM  Justin Wright  has presented today for surgery, with the diagnosis of revision of amputated finger.  The various methods of treatment have been discussed with the patient and family. After consideration of risks, benefits and other options for treatment, the patient has consented to  Procedure(s) with comments: REVISION OF AMPUTATION, LEFT RING FINGER (Left) - LEFT RING FINGER as a surgical intervention.  The patient's history has been reviewed, patient examined, no change in status, stable for surgery.  I have reviewed the patient's chart and labs.  Questions were answered to the patient's satisfaction.     Seher Schlagel

## 2023-10-16 NOTE — ED Triage Notes (Signed)
 Pt arrived via POV, c/o partial amputation to left hand, index finger from log splitter. Bone exposed.

## 2023-10-16 NOTE — H&P (View-Only) (Signed)
 HAND SURGERY CONSULTATION  REQUESTING PHYSICIAN: No att. providers found   Chief Complaint: Left ring finger amputation  HPI: Justin Wright is a 63 y.o. male who presents with an amputation of the left ring finger through the distal phalanx that occurred earlier today while using a log splitter.  He presented to the ER where the wound was evaluated and x-rays were obtained.  Patient describes pain at the tip of his ring finger.  He denies pain elsewhere in the hand.  Hand dominance: Right   History reviewed. No pertinent past medical history. History reviewed. No pertinent surgical history. Social History   Socioeconomic History   Marital status: Married    Spouse name: Not on file   Number of children: Not on file   Years of education: Not on file   Highest education level: Not on file  Occupational History   Occupation: Market researcher  Tobacco Use   Smoking status: Former    Current packs/day: 0.00    Average packs/day: 1 pack/day for 38.0 years (38.0 ttl pk-yrs)    Types: Cigarettes    Start date: 09/18/1974    Quit date: 09/17/2012    Years since quitting: 11.0   Smokeless tobacco: Never  Substance and Sexual Activity   Alcohol use: Yes    Alcohol/week: 4.0 - 6.0 standard drinks of alcohol    Types: 4 - 6 Cans of beer per week   Drug use: Not on file   Sexual activity: Not on file  Other Topics Concern   Not on file  Social History Narrative   Not on file   Social Drivers of Health   Financial Resource Strain: Not on file  Food Insecurity: Not on file  Transportation Needs: Not on file  Physical Activity: Not on file  Stress: Not on file  Social Connections: Not on file   Family History  Problem Relation Age of Onset   Pancreatic cancer Mother 54   - negative except otherwise stated in the family history section No Known Allergies Prior to Admission medications   Medication Sig Start Date End Date Taking? Authorizing Provider   famotidine (PEPCID) 40 MG tablet Take 40 mg by mouth at bedtime.   Yes [provider]  gabapentin (NEURONTIN) 100 MG capsule Take 100-200 mg by mouth See admin instructions. Take 200 mg by mouth at bedtime and an additional 100 mg up to two times a day as needed for pain   Yes [provider]  Ibuprofen 200 MG CAPS Take 400 mg by mouth every 6 (six) hours as needed (for pain).   Yes [provider]  pantoprazole (PROTONIX) 40 MG tablet Take 40 mg by mouth daily before breakfast.   Yes [provider]   DG Finger Ring Left Result Date: 10/16/2023 CLINICAL DATA:  Traumatic amputation. EXAM: LEFT RING FINGER 2+V COMPARISON:  None Available. FINDINGS: Status post traumatic amputation of the distal portion of the fourth distal phalanx with surrounding soft tissues. Moderately displaced fracture of proximal portion of fourth distal phalanx is noted. IMPRESSION: Traumatic amputation of distal portion of fourth distal phalanx with surrounding soft tissues. Moderately displaced fracture of proximal portion of fourth distal phalanx is noted. Electronically Signed   By: Lynwood Landy Raddle M.D.   On: 10/16/2023 16:41   - Positive ROS: All other systems have been reviewed and were otherwise negative with the exception of those mentioned in the HPI and as above.  Physical Exam: General: No acute  distress, resting comfortably Cardiovascular: BUE warm and well perfused, normal rate Respiratory: Normal WOB on RA Skin: Warm and dry Neurologic: Sensation intact distally Psychiatric: Patient is at baseline mood and affect  Left upper Extremity  Amputation of the left ring finger through the distal phalanx at the level of the proximal nail fold.  There is a large amount of exposed distal phalanx present within the wound bed.  The wound margins appear clean.  There does not appear to be any foreign contamination.  Patient has intact flexion at the ring finger PIP joint as well as  full range of motion throughout the remainder of his hand.  All the remaining fingers are pink and well-perfused.   Assessment: 63 year old male with amputation of the left ring finger through the distal phalanx.  Plan: I reviewed the nature of this injury with the patient at length.  After our discussion, we have elected to proceed with revision amputation of the left ring finger.  We reviewed the risks of the surgery at length as well as the expected postoperative course and recovery process.  Questions were encouraged and answered.  Informed consent was signed.  Thank you for the consult and the opportunity to see [name], M.D. [facility] 7:15 PM

## 2023-10-16 NOTE — Progress Notes (Addendum)
 Short Stay Nursing Note: prepared for emergent surgery per MD orders, client then transferred to PACU area to await for Surgeon arrival, verbal report provided to MDA, CRNA and PACU RN. Handoff Report provided at bedside.

## 2023-10-16 NOTE — Op Note (Signed)
   Date of Surgery: 10/16/2023  INDICATIONS: Patient is a 63 y.o.-year-old male with an amputation of the left ring finger through the proximal phalangeal base.  Risks, benefits, and alternatives to surgery were again discussed with the patient in the preoperative area. The patient wishes to proceed with surgery.  Informed consent was signed after our discussion.   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  PROCEDURE:  Revision amputation of left ring finger with closure using local advancement flap   SURGEON: Carlin Galla, M.D.  ASSIST:   ANESTHESIA:  Local, MAC  IV FLUIDS AND URINE: See anesthesia.  ESTIMATED BLOOD LOSS: <5 mL.  IMPLANTS: * No implants in log *   DRAINS: None  COMPLICATIONS: None  DESCRIPTION OF PROCEDURE: The patient was met in the preoperative holding area where the surgical site was marked and the informed consent form was signed.  The patient was then brought back to the operating room and remained on the stretcher.  A hand table was placed adjacent to the operative extremity and locked into place.  A formal timeout was performed to confirm that this was the correct patient, surgical side, surgical site, and surgical procedure.  All were present and in agreement. Following formal timeout, a local block was performed using 10 mL of 0.25% plain marcaine .  The left upper extremity was then prepped and draped in the usual and sterile fashion.   Following formal timeout, cordage Penrose drain was wrapped around the base of the finger, pulled taut, and served as a tourniquet.  I began by examining the wound.  There was an amputation through the distal phalangeal base such that there was very little distal phalanx left.  In fact, the FDP insertion site had avulsed and was fractured off.  I therefore elected to shorten the finger to the level of the middle phalanx.  This would allow for a  tension-free closure.  There was a large radial sided flap of tissue present as this was an oblique amputation.  Following debridement of the bone, the wound was thoroughly irrigated with copious sterile saline.  Blunt dissection was used to identify the radial and ulnar neurovascular bundles.  Traction neurectomies were performed.  I then used the large radial soft tissue flap and a advancement fashion to cover the end of the finger.  This was performed using 4-0 Vicryl Rapide sutures.  Care was taken to ensure that the fingertip was as round as possible without any dogears.  There was abundant soft tissue at the fingertip such that the bone was not running any pressure on the overlying skin.  The wound was then cleaned and dressed with Xeroform, bacitracin  ointment, 4 x 4's, Kling wrap, an AlumaFoam splint, and loose 1 inch Coban.  The patient was reversed from sedation. All counts were correct x 2 at the end of the procedure.  The patient was then taken to the PACU in stable condition.   POSTOPERATIVE PLAN: He will be discharged to home with appropriate pain medication, antibiotics, and discharge instructions.  I'll see him back in 10-14 days for his first postop visit.   Carlin Galla, MD 8:35 PM

## 2023-10-16 NOTE — Transfer of Care (Signed)
 Immediate Anesthesia Transfer of Care Note  Patient: Justin Wright  Procedure(s) Performed: REVISION OF AMPUTATION, LEFT RING FINGER (Left)  Patient Location: PACU  Anesthesia Type:MAC  Level of Consciousness: awake, alert , and oriented  Airway & Oxygen Therapy: Patient Spontanous Breathing  Post-op Assessment: Report given to RN and Post -op Vital signs reviewed and stable  Post vital signs: Reviewed and stable  Last Vitals:  Vitals Value Taken Time  BP 133/90 10/16/23 20:28  Temp    Pulse 55 10/16/23 20:29  Resp 12 10/16/23 20:29  SpO2 97 % 10/16/23 20:29  Vitals shown include unfiled device data.  Last Pain:  Vitals:   10/16/23 1813  TempSrc: Oral  PainSc: 0-No pain         Complications: No notable events documented.

## 2023-10-16 NOTE — Anesthesia Preprocedure Evaluation (Signed)
 Anesthesia Evaluation  Patient identified by MRN, date of birth, ID band Patient awake    Reviewed: Allergy  & Precautions, NPO status , Patient's Chart, lab work & pertinent test results  History of Anesthesia Complications Negative for: history of anesthetic complications  Airway Mallampati: II  TM Distance: >3 FB Neck ROM: Full    Dental  (+) Missing,    Pulmonary former smoker   Pulmonary exam normal        Cardiovascular negative cardio ROS Normal cardiovascular exam     Neuro/Psych negative neurological ROS     GI/Hepatic Neg liver ROS,GERD  Medicated and Controlled,,  Endo/Other  negative endocrine ROS    Renal/GU negative Renal ROS     Musculoskeletal   Abdominal   Peds  Hematology negative hematology ROS (+)   Anesthesia Other Findings amputated finger  Reproductive/Obstetrics                              Anesthesia Physical Anesthesia Plan  ASA: 2 and emergent  Anesthesia Plan: MAC   Post-op Pain Management: Minimal or no pain anticipated   Induction:   PONV Risk Score and Plan: 1 and Treatment may vary due to age or medical condition, Propofol  infusion, Ondansetron  and Midazolam   Airway Management Planned: Natural Airway and Nasal Cannula  Additional Equipment: None  Intra-op Plan:   Post-operative Plan:   Informed Consent: I have reviewed the patients History and Physical, chart, labs and discussed the procedure including the risks, benefits and alternatives for the proposed anesthesia with the patient or authorized representative who has indicated his/her understanding and acceptance.       Plan Discussed with: CRNA  Anesthesia Plan Comments:         Anesthesia Quick Evaluation

## 2023-10-16 NOTE — Discharge Instructions (Signed)
 Waylan Rocher, M.D. Hand Surgery  POST-OPERATIVE DISCHARGE INSTRUCTIONS   PRESCRIPTIONS: You may have been given a prescription to be taken as directed for post-operative pain control.  You may also take over the counter ibuprofen/aleve and tylenol for pain. Take this as directed on the packaging. Do not exceed 3000 mg tylenol/acetaminophen in 24 hours.  Ibuprofen 600-800 mg (3-4) tablets by mouth every 6 hours as needed for pain.  OR Aleve 2 tablets by mouth every 12 hours (twice daily) as needed for pain.  AND/OR Tylenol 1000 mg (2 tablets) every 8 hours as needed for pain.  Please use your pain medication carefully, as refills are limited and you may not be provided with one.  As stated above, please use over the counter pain medicine - it will also be helpful with decreasing your swelling.    ANESTHESIA: After your surgery, post-surgical discomfort or pain is likely. This discomfort can last several days to a few weeks. At certain times of the day your discomfort may be more intense.   Did you receive a nerve block?  A nerve block can provide pain relief for one hour to two days after your surgery. As long as the nerve block is working, you will experience little or no sensation in the area the surgeon operated on.  As the nerve block wears off, you will begin to experience pain or discomfort. It is very important that you begin taking your prescribed pain medication before the nerve block fully wears off. Treating your pain at the first sign of the block wearing off will ensure your pain is better controlled and more tolerable when full-sensation returns. Do not wait until the pain is intolerable, as the medicine will be less effective. It is better to treat pain in advance than to try and catch up.   General Anesthesia:  If you did not receive a nerve block during your surgery, you will need to start taking your pain medication shortly after your surgery and should continue  to do so as prescribed by your surgeon.     ICE AND ELEVATION: You may use ice for the first 48-72 hours, but it is not critical.   Motion of your fingers is very important to decrease the swelling.  Elevation, as much as possible for the next 48 hours, is critical for decreasing swelling as well as for pain relief. Elevation means when you are seated or lying down, you hand should be at or above your heart. When walking, the hand needs to be at or above the level of your elbow.  If the bandage gets too tight, it may need to be loosened. Please contact our office and we will instruct you in how to do this.    SURGICAL BANDAGES:  Keep your dressing and/or splint clean and dry at all times.  Do not remove until you are seen again in the office.  If careful, you may place a plastic bag over your bandage and tape the end to shower, but be careful, do not get your bandages wet.     HAND THERAPY:  You may not need any. If you do, we will begin this at your follow up visit in the clinic.    ACTIVITY AND WORK: You are encouraged to move any fingers which are not in the bandage.  Light use of the fingers is allowed to assist the other hand with daily hygiene and eating, but strong gripping or lifting is often uncomfortable and  should be avoided.  You might miss a variable period of time from work and hopefully this issue has been discussed prior to surgery. You may not do any heavy work with your affected hand for about 2 weeks.    [facility] Second Floor, [address] [place], [place] [address] [phone]

## 2023-10-16 NOTE — Consult Note (Signed)
 HAND SURGERY CONSULTATION  REQUESTING PHYSICIAN: No att. providers found   Chief Complaint: Left ring finger amputation  HPI: Justin Wright is a 63 y.o. male who presents with an amputation of the left ring finger through the distal phalanx that occurred earlier today while using a log splitter.  He presented to the ER where the wound was evaluated and x-rays were obtained.  Patient describes pain at the tip of his ring finger.  He denies pain elsewhere in the hand.  Hand dominance: Right   History reviewed. No pertinent past medical history. History reviewed. No pertinent surgical history. Social History   Socioeconomic History   Marital status: Married    Spouse name: Not on file   Number of children: Not on file   Years of education: Not on file   Highest education level: Not on file  Occupational History   Occupation: Market researcher  Tobacco Use   Smoking status: Former    Current packs/day: 0.00    Average packs/day: 1 pack/day for 38.0 years (38.0 ttl pk-yrs)    Types: Cigarettes    Start date: 09/18/1974    Quit date: 09/17/2012    Years since quitting: 11.0   Smokeless tobacco: Never  Substance and Sexual Activity   Alcohol use: Yes    Alcohol/week: 4.0 - 6.0 standard drinks of alcohol    Types: 4 - 6 Cans of beer per week   Drug use: Not on file   Sexual activity: Not on file  Other Topics Concern   Not on file  Social History Narrative   Not on file   Social Drivers of Health   Financial Resource Strain: Not on file  Food Insecurity: Not on file  Transportation Needs: Not on file  Physical Activity: Not on file  Stress: Not on file  Social Connections: Not on file   Family History  Problem Relation Age of Onset   Pancreatic cancer Mother 54   - negative except otherwise stated in the family history section No Known Allergies Prior to Admission medications   Medication Sig Start Date End Date Taking? Authorizing Provider   famotidine (PEPCID) 40 MG tablet Take 40 mg by mouth at bedtime.   Yes [provider]  gabapentin (NEURONTIN) 100 MG capsule Take 100-200 mg by mouth See admin instructions. Take 200 mg by mouth at bedtime and an additional 100 mg up to two times a day as needed for pain   Yes [provider]  Ibuprofen 200 MG CAPS Take 400 mg by mouth every 6 (six) hours as needed (for pain).   Yes [provider]  pantoprazole (PROTONIX) 40 MG tablet Take 40 mg by mouth daily before breakfast.   Yes [provider]   DG Finger Ring Left Result Date: 10/16/2023 CLINICAL DATA:  Traumatic amputation. EXAM: LEFT RING FINGER 2+V COMPARISON:  None Available. FINDINGS: Status post traumatic amputation of the distal portion of the fourth distal phalanx with surrounding soft tissues. Moderately displaced fracture of proximal portion of fourth distal phalanx is noted. IMPRESSION: Traumatic amputation of distal portion of fourth distal phalanx with surrounding soft tissues. Moderately displaced fracture of proximal portion of fourth distal phalanx is noted. Electronically Signed   By: Lynwood Landy Raddle M.D.   On: 10/16/2023 16:41   - Positive ROS: All other systems have been reviewed and were otherwise negative with the exception of those mentioned in the HPI and as above.  Physical Exam: General: No acute  distress, resting comfortably Cardiovascular: BUE warm and well perfused, normal rate Respiratory: Normal WOB on RA Skin: Warm and dry Neurologic: Sensation intact distally Psychiatric: Patient is at baseline mood and affect  Left upper Extremity  Amputation of the left ring finger through the distal phalanx at the level of the proximal nail fold.  There is a large amount of exposed distal phalanx present within the wound bed.  The wound margins appear clean.  There does not appear to be any foreign contamination.  Patient has intact flexion at the ring finger PIP joint as well as  full range of motion throughout the remainder of his hand.  All the remaining fingers are pink and well-perfused.   Assessment: 63 year old male with amputation of the left ring finger through the distal phalanx.  Plan: I reviewed the nature of this injury with the patient at length.  After our discussion, we have elected to proceed with revision amputation of the left ring finger.  We reviewed the risks of the surgery at length as well as the expected postoperative course and recovery process.  Questions were encouraged and answered.  Informed consent was signed.  Thank you for the consult and the opportunity to see [name], M.D. [facility] 7:15 PM

## 2023-10-17 NOTE — Anesthesia Postprocedure Evaluation (Signed)
 Anesthesia Post Note  Patient: Justin Wright  Procedure(s) Performed: REVISION OF AMPUTATION, LEFT RING FINGER (Left)     Patient location during evaluation: PACU Anesthesia Type: MAC Level of consciousness: awake and alert Pain management: pain level controlled Vital Signs Assessment: post-procedure vital signs reviewed and stable Respiratory status: spontaneous breathing, nonlabored ventilation and respiratory function stable Cardiovascular status: blood pressure returned to baseline Postop Assessment: no apparent nausea or vomiting Anesthetic complications: no   No notable events documented.  Last Vitals:  Vitals:   10/16/23 2045 10/16/23 2059  BP: (!) 149/98 (!) 157/91  Pulse: (!) 51 (!) 53  Resp: 20 15  Temp:  (!) 36.1 C  SpO2: 98% 97%    Last Pain:  Vitals:   10/16/23 2059  TempSrc: Axillary  PainSc: 0-No pain                 Vertell Row

## 2023-10-18 ENCOUNTER — Encounter (HOSPITAL_COMMUNITY): Payer: Self-pay | Admitting: Orthopedic Surgery
